# Patient Record
Sex: Female | Born: 1985 | Race: White | Hispanic: No | Marital: Married | Smoking: Former smoker
Health system: Southern US, Community
[De-identification: ages and names within clinical notes are randomized; demographics above are authoritative.]

## PROBLEM LIST (undated history)

## (undated) DIAGNOSIS — I10 Essential (primary) hypertension: Secondary | ICD-10-CM

## (undated) DIAGNOSIS — IMO0001 Reserved for inherently not codable concepts without codable children: Secondary | ICD-10-CM

## (undated) DIAGNOSIS — M1990 Unspecified osteoarthritis, unspecified site: Secondary | ICD-10-CM

## (undated) DIAGNOSIS — K219 Gastro-esophageal reflux disease without esophagitis: Secondary | ICD-10-CM

## (undated) HISTORY — PX: OTHER SURGICAL HISTORY: SHX169

## (undated) HISTORY — PX: GYNECOLOGIC CRYOSURGERY: SHX857

---
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
## 2004-09-18 ENCOUNTER — Encounter: Admission: RE | Admit: 2004-09-18 | Discharge: 2004-09-18 | Payer: Self-pay | Admitting: Family Medicine

## 2004-10-03 ENCOUNTER — Other Ambulatory Visit: Admission: RE | Admit: 2004-10-03 | Discharge: 2004-10-03 | Payer: Self-pay | Admitting: Obstetrics and Gynecology

## 2005-04-18 ENCOUNTER — Other Ambulatory Visit: Admission: RE | Admit: 2005-04-18 | Discharge: 2005-04-18 | Payer: Self-pay | Admitting: Obstetrics and Gynecology

## 2005-09-05 ENCOUNTER — Ambulatory Visit (HOSPITAL_COMMUNITY): Admission: RE | Admit: 2005-09-05 | Discharge: 2005-09-05 | Payer: Self-pay | Admitting: Specialist

## 2005-09-05 ENCOUNTER — Ambulatory Visit (HOSPITAL_BASED_OUTPATIENT_CLINIC_OR_DEPARTMENT_OTHER): Admission: RE | Admit: 2005-09-05 | Discharge: 2005-09-05 | Payer: Self-pay | Admitting: Specialist

## 2005-09-15 HISTORY — PX: KNEE ARTHROSCOPY: SUR90

## 2005-10-10 ENCOUNTER — Other Ambulatory Visit: Admission: RE | Admit: 2005-10-10 | Discharge: 2005-10-10 | Payer: Self-pay | Admitting: Obstetrics and Gynecology

## 2006-11-26 ENCOUNTER — Other Ambulatory Visit: Admission: RE | Admit: 2006-11-26 | Discharge: 2006-11-26 | Payer: Self-pay | Admitting: Obstetrics and Gynecology

## 2007-06-21 ENCOUNTER — Encounter: Admission: RE | Admit: 2007-06-21 | Discharge: 2007-06-21 | Payer: Self-pay | Admitting: Family Medicine

## 2007-07-26 ENCOUNTER — Encounter: Admission: RE | Admit: 2007-07-26 | Discharge: 2007-07-26 | Payer: Self-pay | Admitting: Gastroenterology

## 2007-09-16 HISTORY — PX: CHOLECYSTECTOMY: SHX55

## 2007-12-02 ENCOUNTER — Other Ambulatory Visit: Admission: RE | Admit: 2007-12-02 | Discharge: 2007-12-02 | Payer: Self-pay | Admitting: Obstetrics and Gynecology

## 2009-07-30 ENCOUNTER — Inpatient Hospital Stay (HOSPITAL_COMMUNITY): Admission: AD | Admit: 2009-07-30 | Discharge: 2009-07-31 | Payer: Self-pay | Admitting: Obstetrics and Gynecology

## 2009-08-01 ENCOUNTER — Inpatient Hospital Stay (HOSPITAL_COMMUNITY): Admission: AD | Admit: 2009-08-01 | Discharge: 2009-08-01 | Payer: Self-pay | Admitting: Obstetrics and Gynecology

## 2009-08-02 ENCOUNTER — Inpatient Hospital Stay (HOSPITAL_COMMUNITY): Admission: AD | Admit: 2009-08-02 | Discharge: 2009-08-03 | Payer: Self-pay | Admitting: Obstetrics and Gynecology

## 2009-08-07 ENCOUNTER — Inpatient Hospital Stay (HOSPITAL_COMMUNITY): Admission: AD | Admit: 2009-08-07 | Discharge: 2009-08-09 | Payer: Self-pay | Admitting: Obstetrics and Gynecology

## 2009-08-08 ENCOUNTER — Encounter: Payer: Self-pay | Admitting: Obstetrics and Gynecology

## 2009-08-10 ENCOUNTER — Ambulatory Visit (HOSPITAL_COMMUNITY): Admission: RE | Admit: 2009-08-10 | Discharge: 2009-08-10 | Payer: Self-pay | Admitting: Obstetrics and Gynecology

## 2009-08-17 ENCOUNTER — Ambulatory Visit: Payer: Self-pay | Admitting: Obstetrics and Gynecology

## 2009-08-17 ENCOUNTER — Inpatient Hospital Stay (HOSPITAL_COMMUNITY): Admission: AD | Admit: 2009-08-17 | Discharge: 2009-08-21 | Payer: Self-pay | Admitting: Obstetrics & Gynecology

## 2009-08-18 ENCOUNTER — Encounter (INDEPENDENT_AMBULATORY_CARE_PROVIDER_SITE_OTHER): Payer: Self-pay | Admitting: Obstetrics & Gynecology

## 2009-08-22 ENCOUNTER — Encounter: Admission: RE | Admit: 2009-08-22 | Discharge: 2009-09-13 | Payer: Self-pay | Admitting: Obstetrics and Gynecology

## 2009-09-22 ENCOUNTER — Encounter: Admission: RE | Admit: 2009-09-22 | Discharge: 2009-10-02 | Payer: Self-pay | Admitting: Obstetrics and Gynecology

## 2010-09-14 IMAGING — US US FETAL BPP W/O NONSTRESS
1 series · 7 of 7 positions shown · non-contrast
Comparison: none

OBSTETRICAL ULTRASOUND:
 This ultrasound exam was performed in the [HOSPITAL] Ultrasound Department.  The OB US report was generated in the AS system, and faxed to the ordering physician.  This report is also available in [HOSPITAL]?s AccessANYware and in [REDACTED] PACS.

[Series 1: us fetal bpp w/o nonstress · non-contrast · 7 acquisitions, 7 frames shown]
[im 1/7]
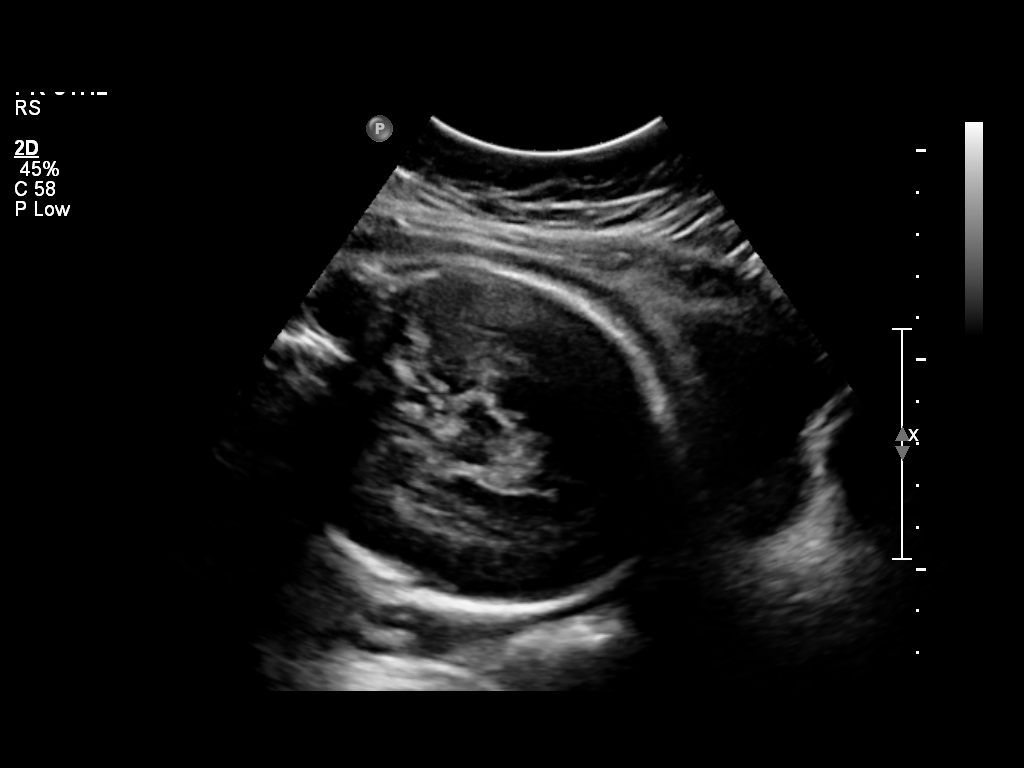
[im 2/7]
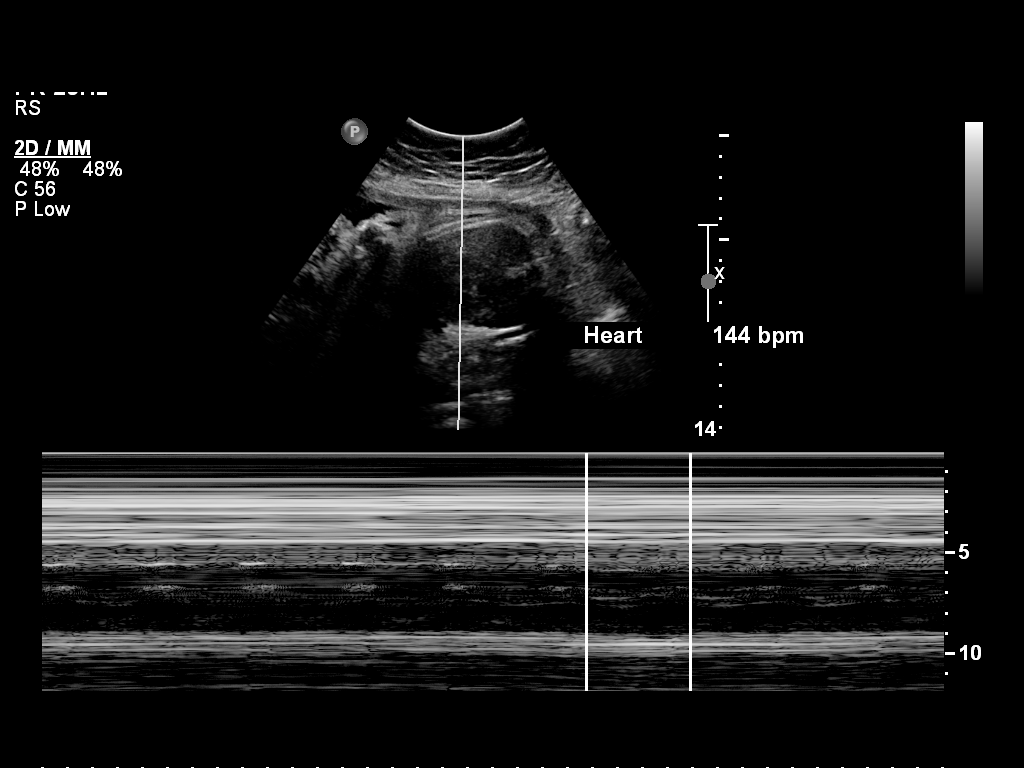
[im 3/7]
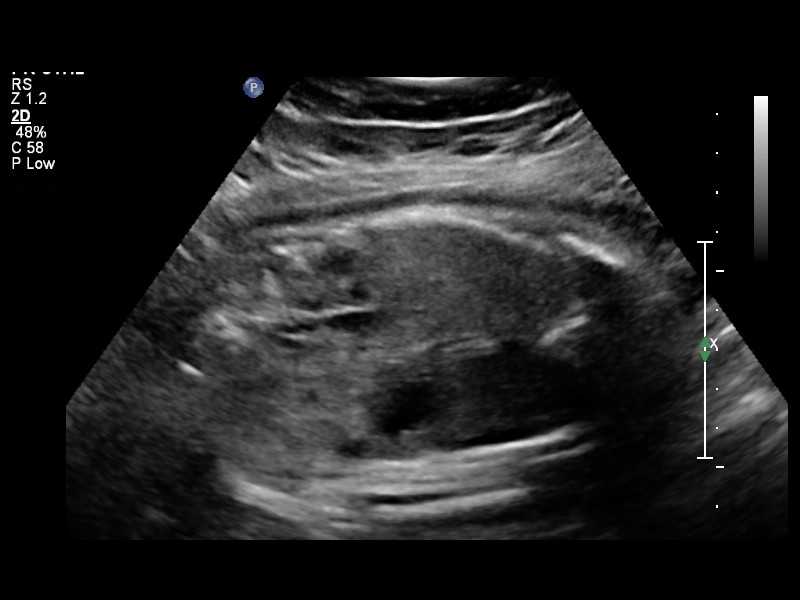
[im 4/7]
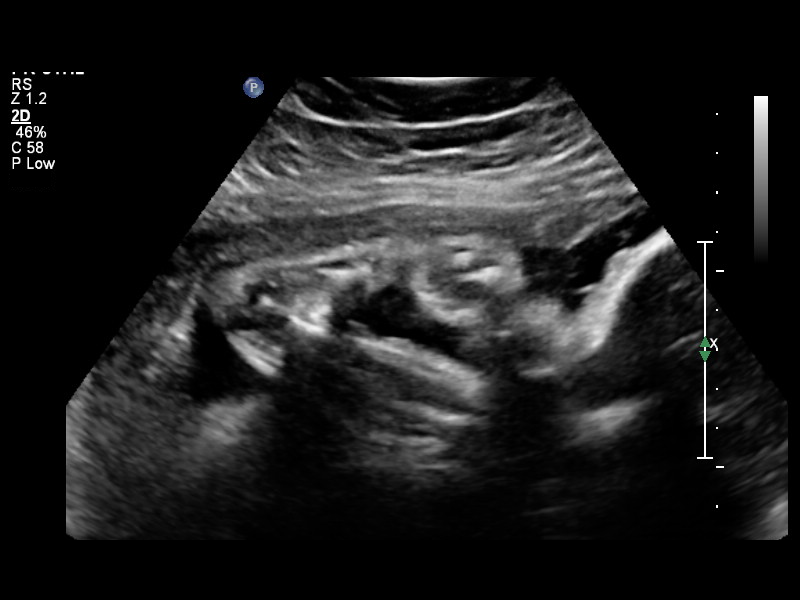
[im 5/7]
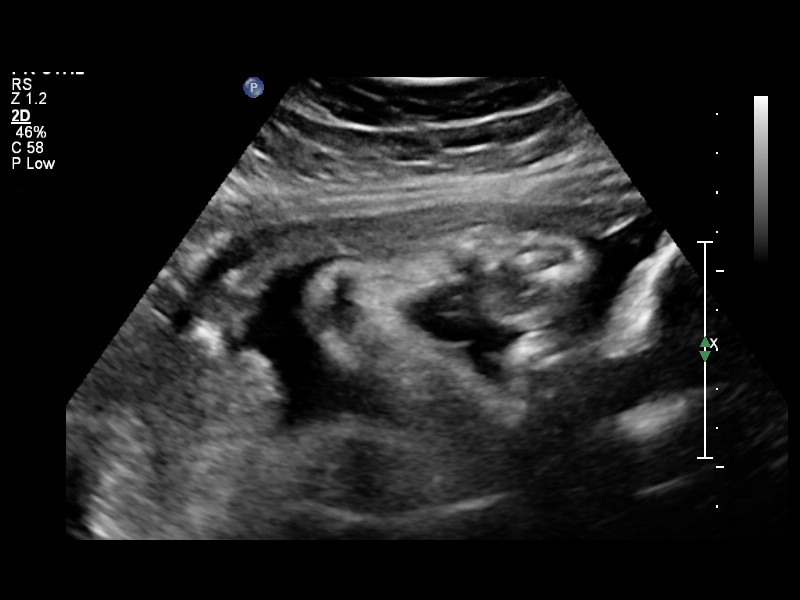
[im 6/7]
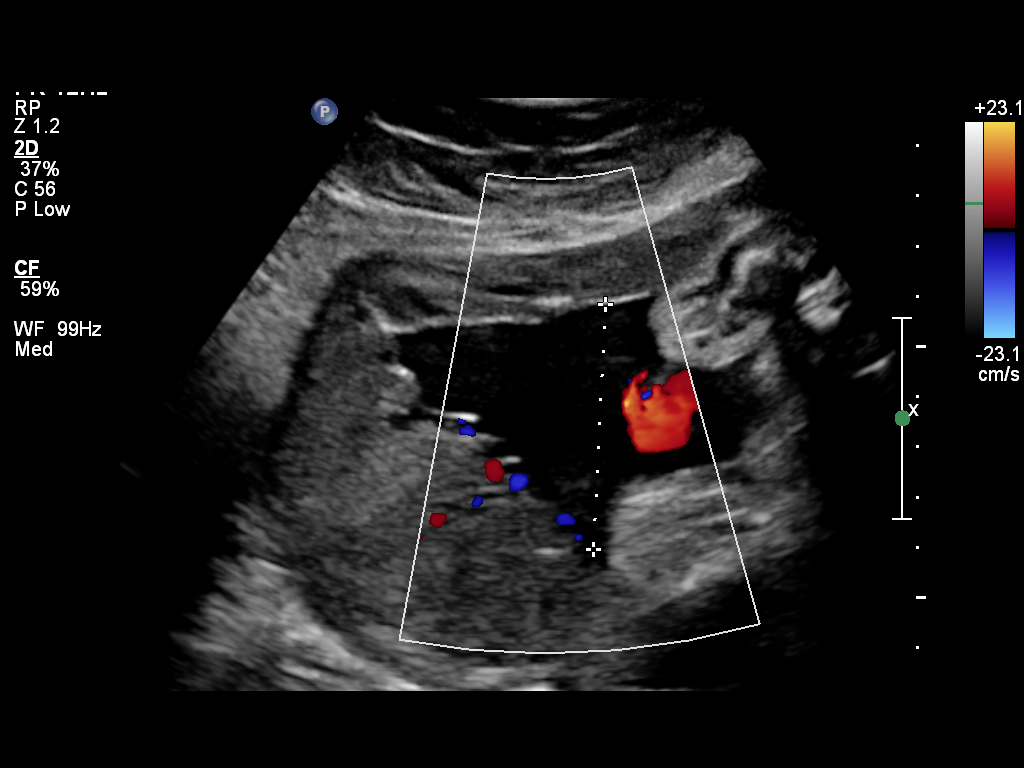
[im 7/7]
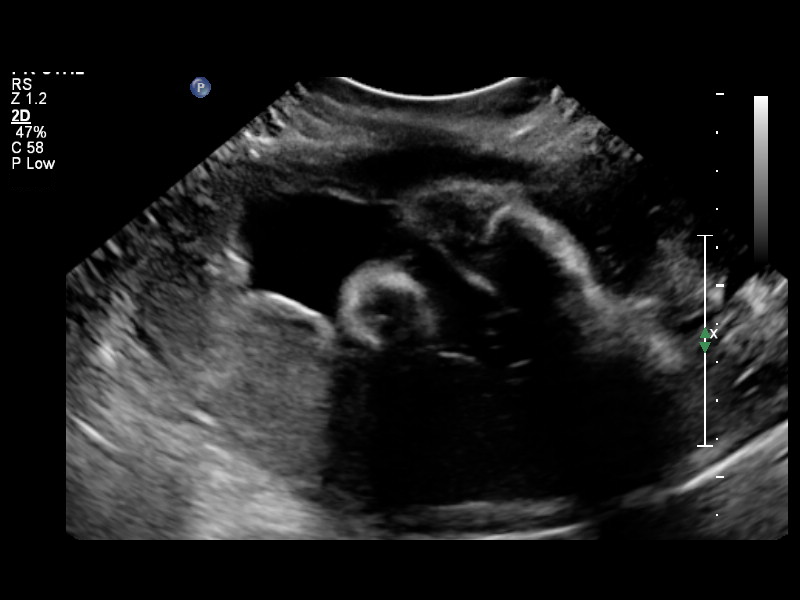

[7 of 7 positions shown; findings below may reference images not displayed]

IMPRESSION: See AS Obstetric US report.

## 2010-10-05 ENCOUNTER — Encounter: Payer: Self-pay | Admitting: Family Medicine

## 2010-12-17 LAB — CREATININE CLEARANCE, URINE, 24 HOUR
Collection Interval-CRCL: 24 hours
Creatinine, Urine: 63.4 mg/dL
Urine Total Volume-CRCL: 2625 mL

## 2010-12-17 LAB — CBC
HCT: 37.3 % (ref 36.0–46.0)
HCT: 40.3 % (ref 36.0–46.0)
Hemoglobin: 12.7 g/dL (ref 12.0–15.0)
Hemoglobin: 13.2 g/dL (ref 12.0–15.0)
Hemoglobin: 13.6 g/dL (ref 12.0–15.0)
MCHC: 33.8 g/dL (ref 30.0–36.0)
MCHC: 33.9 g/dL (ref 30.0–36.0)
MCHC: 34.6 g/dL (ref 30.0–36.0)
MCV: 94.7 fL (ref 78.0–100.0)
MCV: 95.4 fL (ref 78.0–100.0)
RBC: 3.57 MIL/uL — ABNORMAL LOW (ref 3.87–5.11)
RBC: 3.97 MIL/uL (ref 3.87–5.11)
RBC: 4.05 MIL/uL (ref 3.87–5.11)
RBC: 4.25 MIL/uL (ref 3.87–5.11)
RDW: 13.4 % (ref 11.5–15.5)
RDW: 13.5 % (ref 11.5–15.5)
RDW: 14.2 % (ref 11.5–15.5)
WBC: 14.1 10*3/uL — ABNORMAL HIGH (ref 4.0–10.5)
WBC: 15.5 10*3/uL — ABNORMAL HIGH (ref 4.0–10.5)

## 2010-12-17 LAB — COMPREHENSIVE METABOLIC PANEL
ALT: 22 U/L (ref 0–35)
ALT: 27 U/L (ref 0–35)
AST: 25 U/L (ref 0–37)
AST: 26 U/L (ref 0–37)
AST: 26 U/L (ref 0–37)
Alkaline Phosphatase: 85 U/L (ref 39–117)
Alkaline Phosphatase: 87 U/L (ref 39–117)
BUN: 7 mg/dL (ref 6–23)
CO2: 22 mEq/L (ref 19–32)
CO2: 23 mEq/L (ref 19–32)
CO2: 23 mEq/L (ref 19–32)
CO2: 24 mEq/L (ref 19–32)
Calcium: 8.2 mg/dL — ABNORMAL LOW (ref 8.4–10.5)
Calcium: 8.9 mg/dL (ref 8.4–10.5)
Calcium: 9.4 mg/dL (ref 8.4–10.5)
Chloride: 102 mEq/L (ref 96–112)
Chloride: 104 mEq/L (ref 96–112)
Creatinine, Ser: 0.57 mg/dL (ref 0.4–1.2)
Creatinine, Ser: 0.63 mg/dL (ref 0.4–1.2)
GFR calc Af Amer: >60 mL/min (ref >60)
GFR calc Af Amer: >60 mL/min (ref >60)
GFR calc non Af Amer: >60 mL/min (ref >60)
GFR calc non Af Amer: >60 mL/min (ref >60)
Glucose, Bld: 71 mg/dL (ref 70–99)
Glucose, Bld: 71 mg/dL (ref 70–99)
Glucose, Bld: 75 mg/dL (ref 70–99)
Glucose, Bld: 81 mg/dL (ref 70–99)
Potassium: 3.9 mEq/L (ref 3.5–5.1)
Potassium: 4.1 mEq/L (ref 3.5–5.1)
Sodium: 132 mEq/L — ABNORMAL LOW (ref 135–145)
Sodium: 135 mEq/L (ref 135–145)
Total Bilirubin: 0.1 mg/dL — ABNORMAL LOW (ref 0.3–1.2)
Total Bilirubin: 0.2 mg/dL — ABNORMAL LOW (ref 0.3–1.2)
Total Protein: 5.2 g/dL — ABNORMAL LOW (ref 6.0–8.3)
Total Protein: 5.4 g/dL — ABNORMAL LOW (ref 6.0–8.3)
Total Protein: 5.6 g/dL — ABNORMAL LOW (ref 6.0–8.3)

## 2010-12-17 LAB — ABO/RH: ABO/RH(D): A POS

## 2010-12-17 LAB — PROTEIN, URINE, 24 HOUR
Protein, 24H Urine: 971 mg/d — ABNORMAL HIGH (ref 50–100)
Protein, Urine: 37 mg/dL

## 2010-12-17 LAB — TYPE AND SCREEN: Antibody Screen: NEGATIVE

## 2010-12-17 LAB — URIC ACID
Uric Acid, Serum: 6.1 mg/dL (ref 2.4–7.0)
Uric Acid, Serum: 6.7 mg/dL (ref 2.4–7.0)

## 2010-12-17 LAB — LACTATE DEHYDROGENASE
LDH: 143 U/L (ref 94–250)
LDH: 177 U/L (ref 94–250)

## 2010-12-18 LAB — CBC
HCT: 37.1 % (ref 36.0–46.0)
HCT: 39.3 % (ref 36.0–46.0)
Hemoglobin: 13.3 g/dL (ref 12.0–15.0)
Hemoglobin: 13.3 g/dL (ref 12.0–15.0)
MCHC: 34 g/dL (ref 30.0–36.0)
MCV: 94.1 fL (ref 78.0–100.0)
Platelets: 202 10*3/uL (ref 150–400)
Platelets: 233 10*3/uL (ref 150–400)
Platelets: 228 10*3/uL (ref 150–400)
RBC: 4.13 MIL/uL (ref 3.87–5.11)
RDW: 13.4 % (ref 11.5–15.5)
RDW: 13.5 % (ref 11.5–15.5)
RDW: 13.4 % (ref 11.5–15.5)
WBC: 20.4 10*3/uL — ABNORMAL HIGH (ref 4.0–10.5)
WBC: 20.5 10*3/uL — ABNORMAL HIGH (ref 4.0–10.5)

## 2010-12-18 LAB — COMPREHENSIVE METABOLIC PANEL
ALT: 17 U/L (ref 0–35)
ALT: 24 U/L (ref 0–35)
ALT: 16 U/L (ref 0–35)
AST: 26 U/L (ref 0–37)
AST: 39 U/L — ABNORMAL HIGH (ref 0–37)
Albumin: 2.3 g/dL — ABNORMAL LOW (ref 3.5–5.2)
Albumin: 2.6 g/dL — ABNORMAL LOW (ref 3.5–5.2)
Albumin: 2.6 g/dL — ABNORMAL LOW (ref 3.5–5.2)
Alkaline Phosphatase: 91 U/L (ref 39–117)
Alkaline Phosphatase: 94 U/L (ref 39–117)
Alkaline Phosphatase: 96 U/L (ref 39–117)
BUN: 9 mg/dL (ref 6–23)
CO2: 21 mEq/L (ref 19–32)
Calcium: 8.8 mg/dL (ref 8.4–10.5)
Calcium: 9.1 mg/dL (ref 8.4–10.5)
Chloride: 103 mEq/L (ref 96–112)
Creatinine, Ser: 0.58 mg/dL (ref 0.4–1.2)
GFR calc Af Amer: >60 mL/min (ref >60)
GFR calc Af Amer: >60 mL/min (ref >60)
GFR calc Af Amer: >60 mL/min (ref >60)
GFR calc non Af Amer: >60 mL/min (ref >60)
GFR calc non Af Amer: >60 mL/min (ref >60)
Glucose, Bld: 77 mg/dL (ref 70–99)
Potassium: 3.8 mEq/L (ref 3.5–5.1)
Potassium: 4.4 mEq/L (ref 3.5–5.1)
Potassium: 3.9 mEq/L (ref 3.5–5.1)
Sodium: 133 mEq/L — ABNORMAL LOW (ref 135–145)
Total Bilirubin: 0.1 mg/dL — ABNORMAL LOW (ref 0.3–1.2)
Total Bilirubin: 0.2 mg/dL — ABNORMAL LOW (ref 0.3–1.2)
Total Protein: 5.4 g/dL — ABNORMAL LOW (ref 6.0–8.3)
Total Protein: 5.8 g/dL — ABNORMAL LOW (ref 6.0–8.3)

## 2010-12-18 LAB — PROTEIN, URINE, 24 HOUR: Protein, 24H Urine: 280 mg/d — ABNORMAL HIGH (ref 50–100)

## 2010-12-18 LAB — CREATININE CLEARANCE, URINE, 24 HOUR
Collection Interval-CRCL: 24 hours
Creatinine Clearance: 222 mL/min — ABNORMAL HIGH (ref 75–115)
Creatinine, 24H Ur: 1696 mg/d (ref 700–1800)
Creatinine, Urine: 48.5 mg/dL
Urine Total Volume-CRCL: 3500 mL

## 2010-12-18 LAB — URIC ACID: Uric Acid, Serum: 5.7 mg/dL (ref 2.4–7.0)

## 2010-12-18 LAB — LACTATE DEHYDROGENASE
LDH: 126 U/L (ref 94–250)
LDH: 120 U/L (ref 94–250)

## 2011-01-31 NOTE — Op Note (Signed)
Dana [name], Dana [name]             ACCOUNT NO.:  [id number]   MEDICAL RECORD NO.:  [id number]          PATIENT TYPE:  AMB   LOCATION:  [facility]                         FACILITY:  [facility]   PHYSICIAN:  Erasmo Leventhal, M.D.DATE OF BIRTH:  Feb 03, 1986   DATE OF PROCEDURE:  09/05/2005  DATE OF DISCHARGE:  09/05/2005                                 OPERATIVE REPORT   PREOPERATIVE DIAGNOSES:  Right knee patellofemoral maltracking.   POSTOPERATIVE DIAGNOSES:  Right knee patellofemoral maltracking.   PROCEDURE:  Right knee arthroscopic parapatellar synovectomy with  arthroscopic lateral release.   SURGEON:  Erasmo Leventhal, M.D.   ANESTHESIA:  Local block with monitored anesthesia care.   ESTIMATED BLOOD LOSS:  Less than 10 mL.   DRAINS:  None.   COMPLICATIONS:  None.   TOURNIQUET TIME:  None.   DISPOSITION:  PACU stable.   DESCRIPTION OF PROCEDURE:  The patient and family counseled in the holding  area, correct side was identified. Taken to the OR, placed in supine  position. Under MAC anesthesia, the block had been administered in the  holding area previously. Preoperative IV antibiotics had been ordered. She  was then placed on the OR table, placed under monitored anesthesia care,  placed in a thigh holder, prepped with DuraPrep, draped in a sterile  fashion. The standard three portal arthroscopy performed with a proximal  medial, anteromedial and anterolateral portal. Diagnostic arthroscopy was  undertaken. The patellofemoral joint revealed marked lateral tilting. There  was no significant chondromalacia but there was rather noticeable  parapatellar synovitis. Peripatellar synovectomy was performed. The femoral  trochlea was somewhat flat. ACL and PCL were intact.  Medial side was  inspected, medial compartment showed normal articular cartilage, normal  medial meniscus. The lateral side was inspected, normal articular cartilage,  normal lateral meniscus. At this point  in time, again after the peripatellar  synovectomy was performed with motorized shaver, the arthroscopic lateral  release was begun beginning 2 fingerbreadth's superior lateral patella just  distal to the vastus lateralis tendon going sequentially through the  synovium capsule retinaculum being very careful in the subcutaneous tissue  and skin. Patellofemoral tracking was markedly improved at this point in  time. Pump pressure was decreased, meticulous hemostasis was obtained, knee  was irrigated. Arthroscopic equipment was removed. After performing  anesthesia, I supplemented the knee block with a total of 30 mL of 0.25%  Marcaine with epinephrine and 4 mg of morphine sulfate placed into the  portal sites and into the joint. A  sterile dressing was applied to the knee along with a lateral bolster, TED  hose, ice packs, knee immobilizer in full extension. She tolerated the  procedure well with no complications. She was gently awakened, taken from  the operating room to PACU in stable condition.           ______________________________  Erasmo Leventhal, M.D.     RAC/MEDQ  D:  09/05/2005  T:  09/09/2005  Job:  [id number]

## 2015-01-10 LAB — OB RESULTS CONSOLE HIV ANTIBODY (ROUTINE TESTING): HIV: NONREACTIVE

## 2015-01-10 LAB — OB RESULTS CONSOLE ABO/RH: RH TYPE: POSITIVE

## 2015-01-10 LAB — OB RESULTS CONSOLE GC/CHLAMYDIA
CHLAMYDIA, DNA PROBE: NEGATIVE
Gonorrhea: NEGATIVE

## 2015-01-10 LAB — OB RESULTS CONSOLE RPR: RPR: NONREACTIVE

## 2015-01-10 LAB — OB RESULTS CONSOLE HEPATITIS B SURFACE ANTIGEN: HEP B S AG: NEGATIVE

## 2015-01-10 LAB — OB RESULTS CONSOLE ANTIBODY SCREEN: ANTIBODY SCREEN: NEGATIVE

## 2015-01-10 LAB — OB RESULTS CONSOLE RUBELLA ANTIBODY, IGM: RUBELLA: NON-IMMUNE/NOT IMMUNE

## 2015-01-31 ENCOUNTER — Other Ambulatory Visit: Payer: Self-pay | Admitting: Obstetrics and Gynecology

## 2015-02-01 LAB — CYTOLOGY - PAP

## 2015-07-31 NOTE — H&P (Signed)
Dana [name] is a 29 y.o. female presenting at 5739 weeks for repeat cesarean section. 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   Desires repeat cesarean section declines TOL.  Prenatal course complicated by chronic hypertension on hydrochlorothiazide.  Maternal Medical History:  Fetal activity: Perceived fetal activity is normal.    Prenatal complications: PIH.   Prenatal Complications - Diabetes: none.    OB History    No data available     No past medical history on file. No past surgical history on file. Family History: family history is not on file. Social History:  has no tobacco, alcohol, and drug history on file.   Prenatal Transfer Tool  Maternal Diabetes: No Genetic Screening: Normal Maternal Ultrasounds/Referrals: Normal Fetal Ultrasounds or other Referrals:  None Maternal Substance Abuse:  No Significant Maternal Medications:  None Significant Maternal Lab Results:  None Other Comments:  None  ROS    There were no vitals taken for this visit. Maternal Exam:  Abdomen: Surgical scars: low transverse.   Fetal presentation: vertex     Physical Exam  Constitutional: She is oriented to person, place, and time. She appears well-developed and well-nourished.  HENT:  Head: Normocephalic.  Cardiovascular: Normal rate, regular rhythm and normal heart sounds.   Respiratory: Effort normal and breath sounds normal.  GI:  Gravid uterus c/w dates  Well healed low transverse incision.  Musculoskeletal: Normal range of motion. She exhibits edema.  Neurological: She is alert and oriented to person, place, and time. She has normal reflexes.    Prenatal labs: ABO, Rh:   Antibody:   Rubella:   RPR:    HBsAg:    HIV:    GBS:     Assessment/Plan: IUP at 39 weeks for repeat cesarean section Chronic hypertension Risk of cesarean section discussed.  These include:  Risk of infection;  Risk of hemorrhage that could require transfusions with the  associated risk of aids or hepatitis;  Excessive bleeding could require hysterectomy;  Risk of injury to adjacent organs including bladder, bowel or ureters;  Risk of DVT's and possible pulmonary embolus.  Patient expresses a understanding of indications and risks.;   Dana [name] S 07/31/2015, 9:58 AM

## 2015-08-08 ENCOUNTER — Other Ambulatory Visit (HOSPITAL_COMMUNITY): Payer: Self-pay

## 2015-08-08 ENCOUNTER — Encounter (HOSPITAL_COMMUNITY): Payer: Self-pay

## 2015-08-08 NOTE — Patient Instructions (Addendum)
Your procedure is scheduled on:  Monday, Nov. 28, 2016  [facility] at:  11:30 AM  Pick up the phone at the desk and dial [phone].  Call this number if you have problems the morning of surgery: [phone].  Remember: Do NOT eat food:  After Midnight Sunday Do NOT drink clear liquids after: 9:00 AM Day of Surgery Take these medicines the morning of surgery with a SIP OF WATER:  Hydrochlorothiazide, Zantac, Dexilant   Do NOT wear jewelry (body piercing), metal hair clips/bobby pins, or nail polish. Do NOT wear lotions, powders, or perfumes.  You may wear deoderant. Do NOT shave for 48 hours prior to surgery. Do NOT bring valuables to the hospital. Leave suitcase in car.  After surgery it may be brought to your room.  For patients admitted to the hospital, checkout time is 11:00 AM the day of discharge.

## 2015-08-10 ENCOUNTER — Encounter (HOSPITAL_COMMUNITY)
Admission: RE | Admit: 2015-08-10 | Discharge: 2015-08-10 | Disposition: A | Discharge status: Home / Self Care | Payer: PRIVATE HEALTH INSURANCE | Source: Ambulatory Visit | Attending: Obstetrics and Gynecology | Admitting: Obstetrics and Gynecology

## 2015-08-10 ENCOUNTER — Encounter (HOSPITAL_COMMUNITY): Payer: Self-pay

## 2015-08-10 HISTORY — DX: Gastro-esophageal reflux disease without esophagitis: K21.9

## 2015-08-10 HISTORY — DX: Reserved for inherently not codable concepts without codable children: IMO0001

## 2015-08-10 HISTORY — DX: Unspecified osteoarthritis, unspecified site: M19.90

## 2015-08-10 HISTORY — DX: Essential (primary) hypertension: I10

## 2015-08-10 LAB — CBC
HEMATOCRIT: 38.3 % (ref 36.0–46.0)
Hemoglobin: 12.9 g/dL (ref 12.0–15.0)
MCH: 30.7 pg (ref 26.0–34.0)
MCHC: 33.7 g/dL (ref 30.0–36.0)
MCV: 91.2 fL (ref 78.0–100.0)
Platelets: 306 10*3/uL (ref 150–400)
RBC: 4.2 MIL/uL (ref 3.87–5.11)
RDW: 13.3 % (ref 11.5–15.5)
WBC: 14.6 10*3/uL — AB (ref 4.0–10.5)

## 2015-08-10 LAB — BASIC METABOLIC PANEL
ANION GAP: 10 (ref 5–15)
BUN: 7 mg/dL (ref 6–20)
CHLORIDE: 101 mmol/L (ref 101–111)
CO2: 23 mmol/L (ref 22–32)
Calcium: 9.4 mg/dL (ref 8.9–10.3)
Creatinine, Ser: 0.42 mg/dL — ABNORMAL LOW (ref 0.44–1.00)
Glucose, Bld: 92 mg/dL (ref 65–99)
POTASSIUM: 3.5 mmol/L (ref 3.5–5.1)
SODIUM: 134 mmol/L — AB (ref 135–145)

## 2015-08-11 LAB — RPR: RPR Ser Ql: NONREACTIVE

## 2015-08-12 MED ORDER — GENTAMICIN SULFATE 40 MG/ML IJ SOLN
INTRAMUSCULAR | Status: AC
  Administered 2015-08-13: 114.75 mL via INTRAVENOUS
  Filled 2015-08-12: qty 8.75

## 2015-08-13 ENCOUNTER — Encounter (HOSPITAL_COMMUNITY): Payer: Self-pay | Admitting: *Deleted

## 2015-08-13 ENCOUNTER — Inpatient Hospital Stay (HOSPITAL_COMMUNITY): Payer: PRIVATE HEALTH INSURANCE | Admitting: Anesthesiology

## 2015-08-13 ENCOUNTER — Inpatient Hospital Stay (HOSPITAL_COMMUNITY)
Admission: RE | Admit: 2015-08-13 | Discharge: 2015-08-15 | DRG: 766 | Disposition: A | Discharge status: Home / Self Care | Payer: PRIVATE HEALTH INSURANCE | Source: Ambulatory Visit | Attending: Obstetrics and Gynecology | Admitting: Obstetrics and Gynecology

## 2015-08-13 ENCOUNTER — Encounter (HOSPITAL_COMMUNITY)
Admission: RE | Disposition: A | Discharge status: Home / Self Care | Payer: Self-pay | Source: Ambulatory Visit | Attending: Obstetrics and Gynecology

## 2015-08-13 DIAGNOSIS — O164 Unspecified maternal hypertension, complicating childbirth: Secondary | ICD-10-CM | POA: Diagnosis present

## 2015-08-13 DIAGNOSIS — Z98891 History of uterine scar from previous surgery: Secondary | ICD-10-CM

## 2015-08-13 DIAGNOSIS — E669 Obesity, unspecified: Secondary | ICD-10-CM | POA: Diagnosis present

## 2015-08-13 DIAGNOSIS — Z3A39 39 weeks gestation of pregnancy: Secondary | ICD-10-CM | POA: Diagnosis not present

## 2015-08-13 DIAGNOSIS — M1990 Unspecified osteoarthritis, unspecified site: Secondary | ICD-10-CM | POA: Diagnosis present

## 2015-08-13 DIAGNOSIS — O34211 Maternal care for low transverse scar from previous cesarean delivery: Principal | ICD-10-CM | POA: Diagnosis present

## 2015-08-13 DIAGNOSIS — K219 Gastro-esophageal reflux disease without esophagitis: Secondary | ICD-10-CM | POA: Diagnosis present

## 2015-08-13 DIAGNOSIS — O9962 Diseases of the digestive system complicating childbirth: Secondary | ICD-10-CM | POA: Diagnosis present

## 2015-08-13 DIAGNOSIS — Z87891 Personal history of nicotine dependence: Secondary | ICD-10-CM | POA: Diagnosis not present

## 2015-08-13 DIAGNOSIS — Z6835 Body mass index (BMI) 35.0-35.9, adult: Secondary | ICD-10-CM | POA: Diagnosis not present

## 2015-08-13 DIAGNOSIS — O99214 Obesity complicating childbirth: Secondary | ICD-10-CM | POA: Diagnosis present

## 2015-08-13 LAB — PREPARE RBC (CROSSMATCH)

## 2015-08-13 SURGERY — Surgical Case
Anesthesia: Spinal

## 2015-08-13 MED ORDER — NALOXONE HCL 2 MG/2ML IJ SOSY
1.0000 ug/kg/h | PREFILLED_SYRINGE | INTRAMUSCULAR | Status: DC | PRN
Start: 2015-08-13 — End: 2015-08-15
  Filled 2015-08-13: qty 2

## 2015-08-13 MED ORDER — BISACODYL 10 MG RE SUPP: 10.0000 mg | Freq: Every day | RECTAL | Status: DC | PRN

## 2015-08-13 MED ORDER — OXYTOCIN 10 UNIT/ML IJ SOLN
INTRAMUSCULAR | Status: AC
  Filled 2015-08-13: qty 4

## 2015-08-13 MED ORDER — SENNOSIDES-DOCUSATE SODIUM 8.6-50 MG PO TABS
2.0000 | ORAL_TABLET | ORAL | Status: DC
  Administered 2015-08-14: 2 via ORAL
  Filled 2015-08-13 (×2): qty 2

## 2015-08-13 MED ORDER — MORPHINE SULFATE (PF) 0.5 MG/ML IJ SOLN
INTRAMUSCULAR | Status: AC
  Filled 2015-08-13: qty 10

## 2015-08-13 MED ORDER — MENTHOL 3 MG MT LOZG
1.0000 | LOZENGE | OROMUCOSAL | Status: DC | PRN
Start: 2015-08-13 — End: 2015-08-15

## 2015-08-13 MED ORDER — DEXAMETHASONE SODIUM PHOSPHATE 4 MG/ML IJ SOLN
INTRAMUSCULAR | Status: DC | PRN
  Administered 2015-08-13: 4 mg via INTRAVENOUS

## 2015-08-13 MED ORDER — SODIUM CHLORIDE 0.9 % IJ SOLN: 3.0000 mL | INTRAMUSCULAR | Status: DC | PRN

## 2015-08-13 MED ORDER — SIMETHICONE 80 MG PO CHEW
80.0000 mg | CHEWABLE_TABLET | Freq: Three times a day (TID) | ORAL | Status: DC
Start: 2015-08-13 — End: 2015-08-15
  Administered 2015-08-13 – 2015-08-15 (×4): 80 mg via ORAL
  Filled 2015-08-13 (×4): qty 1

## 2015-08-13 MED ORDER — OXYCODONE-ACETAMINOPHEN 5-325 MG PO TABS: 2.0000 | ORAL_TABLET | ORAL | Status: DC | PRN

## 2015-08-13 MED ORDER — NALBUPHINE HCL 10 MG/ML IJ SOLN: 5.0000 mg | INTRAMUSCULAR | Status: DC | PRN

## 2015-08-13 MED ORDER — FENTANYL CITRATE (PF) 100 MCG/2ML IJ SOLN
INTRAMUSCULAR | Status: AC
  Filled 2015-08-13: qty 2

## 2015-08-13 MED ORDER — SCOPOLAMINE 1 MG/3DAYS TD PT72
1.0000 | MEDICATED_PATCH | Freq: Once | TRANSDERMAL | Status: DC
  Administered 2015-08-13: 1.5 mg via TRANSDERMAL

## 2015-08-13 MED ORDER — SCOPOLAMINE 1 MG/3DAYS TD PT72
MEDICATED_PATCH | TRANSDERMAL | Status: AC
  Administered 2015-08-13: 1.5 mg via TRANSDERMAL
  Filled 2015-08-13: qty 1

## 2015-08-13 MED ORDER — IBUPROFEN 600 MG PO TABS
600.0000 mg | ORAL_TABLET | Freq: Four times a day (QID) | ORAL | Status: DC
  Administered 2015-08-14 – 2015-08-15 (×6): 600 mg via ORAL
  Filled 2015-08-13 (×6): qty 1

## 2015-08-13 MED ORDER — OXYTOCIN 40 UNITS IN LACTATED RINGERS INFUSION - SIMPLE MED: 62.5000 mL/h | INTRAVENOUS | Status: AC

## 2015-08-13 MED ORDER — SODIUM CHLORIDE 0.9 % IV SOLN
250.0000 mL | INTRAVENOUS | Status: DC
Start: 2015-08-14 — End: 2015-08-15

## 2015-08-13 MED ORDER — MORPHINE SULFATE (PF) 0.5 MG/ML IJ SOLN
INTRAMUSCULAR | Status: DC | PRN
  Administered 2015-08-13: .15 mg via INTRATHECAL

## 2015-08-13 MED ORDER — SODIUM CHLORIDE 0.9 % IR SOLN
Status: DC | PRN
  Administered 2015-08-13: 1000 mL

## 2015-08-13 MED ORDER — KETOROLAC TROMETHAMINE 30 MG/ML IJ SOLN
30.0000 mg | Freq: Four times a day (QID) | INTRAMUSCULAR | Status: AC | PRN
  Administered 2015-08-13: 30 mg via INTRAMUSCULAR

## 2015-08-13 MED ORDER — ONDANSETRON HCL 4 MG/2ML IJ SOLN
INTRAMUSCULAR | Status: AC
  Filled 2015-08-13: qty 2

## 2015-08-13 MED ORDER — FLEET ENEMA 7-19 GM/118ML RE ENEM: 1.0000 | ENEMA | Freq: Every day | RECTAL | Status: DC | PRN

## 2015-08-13 MED ORDER — TETANUS-DIPHTH-ACELL PERTUSSIS 5-2.5-18.5 LF-MCG/0.5 IM SUSP
0.5000 mL | Freq: Once | INTRAMUSCULAR | Status: DC
Start: 2015-08-14 — End: 2015-08-15

## 2015-08-13 MED ORDER — KETOROLAC TROMETHAMINE 30 MG/ML IJ SOLN
INTRAMUSCULAR | Status: AC
  Administered 2015-08-13: 30 mg via INTRAMUSCULAR
  Filled 2015-08-13: qty 1

## 2015-08-13 MED ORDER — SIMETHICONE 80 MG PO CHEW
80.0000 mg | CHEWABLE_TABLET | ORAL | Status: DC
  Administered 2015-08-14 (×2): 80 mg via ORAL
  Filled 2015-08-13 (×2): qty 1

## 2015-08-13 MED ORDER — PHENYLEPHRINE 8 MG IN D5W 100 ML (0.08MG/ML) PREMIX OPTIME
INJECTION | INTRAVENOUS | Status: AC
  Filled 2015-08-13: qty 100

## 2015-08-13 MED ORDER — DIPHENHYDRAMINE HCL 50 MG/ML IJ SOLN: 12.5000 mg | INTRAMUSCULAR | Status: DC | PRN

## 2015-08-13 MED ORDER — SIMETHICONE 80 MG PO CHEW: 80.0000 mg | CHEWABLE_TABLET | ORAL | Status: DC | PRN

## 2015-08-13 MED ORDER — KETOROLAC TROMETHAMINE 30 MG/ML IJ SOLN: 30.0000 mg | Freq: Four times a day (QID) | INTRAMUSCULAR | Status: AC | PRN

## 2015-08-13 MED ORDER — SODIUM CHLORIDE 0.9 % IJ SOLN: 3.0000 mL | Freq: Two times a day (BID) | INTRAMUSCULAR | Status: DC

## 2015-08-13 MED ORDER — ZOLPIDEM TARTRATE 5 MG PO TABS: 5.0000 mg | ORAL_TABLET | Freq: Every evening | ORAL | Status: DC | PRN

## 2015-08-13 MED ORDER — OXYTOCIN 10 UNIT/ML IJ SOLN
40.0000 [IU] | INTRAVENOUS | Status: DC | PRN
  Administered 2015-08-13: 40 [IU] via INTRAVENOUS

## 2015-08-13 MED ORDER — FENTANYL CITRATE (PF) 100 MCG/2ML IJ SOLN
INTRAMUSCULAR | Status: DC | PRN
  Administered 2015-08-13: 25 ug via INTRATHECAL

## 2015-08-13 MED ORDER — ONDANSETRON HCL 4 MG/2ML IJ SOLN
INTRAMUSCULAR | Status: DC | PRN
  Administered 2015-08-13: 4 mg via INTRAVENOUS

## 2015-08-13 MED ORDER — MEPERIDINE HCL 25 MG/ML IJ SOLN: 6.2500 mg | INTRAMUSCULAR | Status: DC | PRN

## 2015-08-13 MED ORDER — NALBUPHINE HCL 10 MG/ML IJ SOLN
5.0000 mg | Freq: Once | INTRAMUSCULAR | Status: AC | PRN
  Administered 2015-08-13: 5 mg via INTRAVENOUS

## 2015-08-13 MED ORDER — LANOLIN HYDROUS EX OINT: 1.0000 "application " | TOPICAL_OINTMENT | CUTANEOUS | Status: DC | PRN

## 2015-08-13 MED ORDER — PRENATAL MULTIVITAMIN CH
1.0000 | ORAL_TABLET | Freq: Every day | ORAL | Status: DC
  Administered 2015-08-14: 1 via ORAL
  Filled 2015-08-13: qty 1

## 2015-08-13 MED ORDER — NALBUPHINE HCL 10 MG/ML IJ SOLN
INTRAMUSCULAR | Status: AC
  Filled 2015-08-13: qty 1

## 2015-08-13 MED ORDER — DIBUCAINE 1 % RE OINT: 1.0000 "application " | TOPICAL_OINTMENT | RECTAL | Status: DC | PRN

## 2015-08-13 MED ORDER — LACTATED RINGERS IV SOLN
INTRAVENOUS | Status: DC
  Administered 2015-08-13: 21:00:00 via INTRAVENOUS

## 2015-08-13 MED ORDER — BUPIVACAINE IN DEXTROSE 0.75-8.25 % IT SOLN
INTRATHECAL | Status: DC | PRN
  Administered 2015-08-13: 1.5 mL via INTRATHECAL

## 2015-08-13 MED ORDER — LACTATED RINGERS IV SOLN
INTRAVENOUS | Status: DC
  Administered 2015-08-13 (×2): via INTRAVENOUS

## 2015-08-13 MED ORDER — OXYCODONE-ACETAMINOPHEN 5-325 MG PO TABS: 1.0000 | ORAL_TABLET | ORAL | Status: DC | PRN

## 2015-08-13 MED ORDER — NALBUPHINE HCL 10 MG/ML IJ SOLN: 5.0000 mg | Freq: Once | INTRAMUSCULAR | Status: AC | PRN

## 2015-08-13 MED ORDER — FENTANYL CITRATE (PF) 100 MCG/2ML IJ SOLN: 25.0000 ug | INTRAMUSCULAR | Status: DC | PRN

## 2015-08-13 MED ORDER — DEXAMETHASONE SODIUM PHOSPHATE 4 MG/ML IJ SOLN
INTRAMUSCULAR | Status: AC
  Filled 2015-08-13: qty 1

## 2015-08-13 MED ORDER — IBUPROFEN 600 MG PO TABS: 600.0000 mg | ORAL_TABLET | Freq: Four times a day (QID) | ORAL | Status: DC | PRN

## 2015-08-13 MED ORDER — WITCH HAZEL-GLYCERIN EX PADS: 1.0000 "application " | MEDICATED_PAD | CUTANEOUS | Status: DC | PRN

## 2015-08-13 MED ORDER — PHENYLEPHRINE 8 MG IN D5W 100 ML (0.08MG/ML) PREMIX OPTIME
INJECTION | INTRAVENOUS | Status: DC | PRN
  Administered 2015-08-13: 30 ug/min via INTRAVENOUS

## 2015-08-13 MED ORDER — DIPHENHYDRAMINE HCL 25 MG PO CAPS
25.0000 mg | ORAL_CAPSULE | ORAL | Status: DC | PRN
  Filled 2015-08-13: qty 1

## 2015-08-13 MED ORDER — DIPHENHYDRAMINE HCL 25 MG PO CAPS: 25.0000 mg | ORAL_CAPSULE | Freq: Four times a day (QID) | ORAL | Status: DC | PRN

## 2015-08-13 MED ORDER — HYDROCHLOROTHIAZIDE 25 MG PO TABS
25.0000 mg | ORAL_TABLET | Freq: Every day | ORAL | Status: DC
Start: 2015-08-13 — End: 2015-08-14
  Filled 2015-08-13 (×2): qty 1

## 2015-08-13 MED ORDER — NALOXONE HCL 0.4 MG/ML IJ SOLN: 0.4000 mg | INTRAMUSCULAR | Status: DC | PRN

## 2015-08-13 MED ORDER — ONDANSETRON HCL 4 MG/2ML IJ SOLN: 4.0000 mg | Freq: Three times a day (TID) | INTRAMUSCULAR | Status: DC | PRN

## 2015-08-13 MED ORDER — ACETAMINOPHEN 325 MG PO TABS
650.0000 mg | ORAL_TABLET | ORAL | Status: DC | PRN
  Administered 2015-08-13: 650 mg via ORAL
  Filled 2015-08-13: qty 2

## 2015-08-13 SURGICAL SUPPLY — 29 items
CLAMP CORD UMBIL (MISCELLANEOUS) IMPLANT
CLOTH BEACON ORANGE TIMEOUT ST (SAFETY) ×3 IMPLANT
DRAPE SHEET LG 3/4 BI-LAMINATE (DRAPES) IMPLANT
DRSG OPSITE POSTOP 4X10 (GAUZE/BANDAGES/DRESSINGS) ×3 IMPLANT
DURAPREP 26ML APPLICATOR (WOUND CARE) ×3 IMPLANT
ELECT REM PT RETURN 9FT ADLT (ELECTROSURGICAL) ×3
ELECTRODE REM PT RTRN 9FT ADLT (ELECTROSURGICAL) ×1 IMPLANT
EXTRACTOR VACUUM M CUP 4 TUBE (SUCTIONS) IMPLANT
EXTRACTOR VACUUM M CUP 4' TUBE (SUCTIONS)
GLOVE BIO SURGEON STRL SZ7 (GLOVE) ×3 IMPLANT
GLOVE BIOGEL PI IND STRL 7.0 (GLOVE) ×1 IMPLANT
GLOVE BIOGEL PI INDICATOR 7.0 (GLOVE) ×2
GOWN STRL REUS W/TWL LRG LVL3 (GOWN DISPOSABLE) ×6 IMPLANT
KIT ABG SYR 3ML LUER SLIP (SYRINGE) ×3 IMPLANT
LIQUID BAND (GAUZE/BANDAGES/DRESSINGS) ×3 IMPLANT
NEEDLE HYPO 25X5/8 SAFETYGLIDE (NEEDLE) ×3 IMPLANT
NS IRRIG 1000ML POUR BTL (IV SOLUTION) ×3 IMPLANT
PACK C SECTION WH (CUSTOM PROCEDURE TRAY) ×3 IMPLANT
PAD OB MATERNITY 4.3X12.25 (PERSONAL CARE ITEMS) ×3 IMPLANT
PENCIL SMOKE EVAC W/HOLSTER (ELECTROSURGICAL) ×3 IMPLANT
SUT CHROMIC 0 CTX 36 (SUTURE) ×6 IMPLANT
SUT MON AB 4-0 PS1 27 (SUTURE) ×3 IMPLANT
SUT PDS AB 0 CT 36 (SUTURE) ×3 IMPLANT
SUT PLAIN 0 NONE (SUTURE) IMPLANT
SUT PLAIN 2 0 XLH (SUTURE) IMPLANT
SUT VIC AB 3-0 CT1 27 (SUTURE) ×2
SUT VIC AB 3-0 CT1 TAPERPNT 27 (SUTURE) ×1 IMPLANT
TOWEL OR 17X24 6PK STRL BLUE (TOWEL DISPOSABLE) ×3 IMPLANT
TRAY FOLEY CATH SILVER 14FR (SET/KITS/TRAYS/PACK) ×3 IMPLANT

## 2015-08-13 NOTE — Anesthesia Postprocedure Evaluation (Signed)
Anesthesia Post Note  Patient: Dana PennerJessica C [name]  Procedure(s) Performed: Procedure(s) (LRB): CESAREAN SECTION REPEAT (N/A)  Patient location during evaluation: PACU Anesthesia Type: Spinal Level of consciousness: awake Pain management: pain level controlled Respiratory status: spontaneous breathing Cardiovascular status: stable Postop Assessment: No headache, No backache and Spinal receding Anesthetic complications: no    Last Vitals:  Filed Vitals:   08/13/15 1534 08/13/15 1535  BP:    Pulse: 78 81  Temp:    Resp: 18 19    Last Pain:  Filed Vitals:   08/13/15 1536  PainSc: 0-No pain                 Raistlin Gum JR,JOHN Izen Petz

## 2015-08-13 NOTE — Lactation Note (Signed)
This note was copied from the chart of Dana Eustace PenJessica Cravey. Lactation Consultation Note  Patient Name: Dana [name] QMVHQ'IToday's Date: 08/13/2015 Reason for consult: Initial assessment   With this mom and term baby, now 8 hours old. Mom 's first was a NICU premie, and he never latched, . Mom has been on HCTZ and has a history high blood pressure. She states her milk came in well with her first, but was gone after 3 months of pumping. I avised mom to stay hydrated, and that if she needs HCTZ, , that she is more important than her milk.  Harlow [place] was too sleepy to latch, but mom will call when she is showing hunger cues. Basic breast feeding teaching done, and lactation services reviewed.    Maternal Data Formula Feeding for Exclusion: No Has patient been taught Hand Expression?: Yes Does the patient have breastfeeding experience prior to this delivery?: Yes  Feeding Feeding Type: Breast Fed  LATCH Score/Interventions Latch: Too sleepy or reluctant, no latch achieved, no sucking elicited. (post path, baby too sleepy - no latch) Intervention(s): Skin to skin;Teach feeding cues (mom has easily expressed colostrum) Intervention(s): Adjust position;Assist with latch;Breast compression  Audible Swallowing: None  Type of Nipple: Flat (evert with stmulation)  Comfort (Breast/Nipple): Soft / non-tender     Hold (Positioning): Assistance needed to correctly position infant at breast and maintain latch. Intervention(s): Breastfeeding basics reviewed;Support Pillows;Position options;Skin to skin  LATCH Score: 4  Lactation Tools Discussed/Used     Consult Status Consult Status: Follow-up Date: 08/14/15 Follow-up type: In-patient    Alfred LevinsLee, Manvir Thorson Anne 08/13/2015, 9:31 PM

## 2015-08-13 NOTE — Consult Note (Signed)
[facility]  Delivery Note:  C-section       08/13/2015  1:12 PM  I was called to the operating room at the request of the patient's obstetrician (Dr. McComb) for a repeat c-section.  PRENATAL HX:  29 y/o G2P1001 at 39 weeks.  Prenatal course complicated by chronic hypertension on hydrochlorothiazide.  INTRAPARTUM HX:   Repeat c-section with AROM at delivery  DELIVERY:  Infant was vigorous at delivery, requiring no resuscitation other than standard warming, drying and stimulation.  APGARs 9 and 9.  Exam within normal limits.  After 5 minutes, baby left with nurse to assist parents with skin-to-skin care.   _____________________ Electronically Signed By: Kenyotta Dorfman, MD Neonatologist  

## 2015-08-13 NOTE — Transfer of Care (Signed)
Immediate Anesthesia Transfer of Care Note  Patient: Dana [name]  Procedure(s) Performed: Procedure(s) with comments: CESAREAN SECTION REPEAT (N/A) - edc 08/20/15   Patient Location: PACU  Anesthesia Type:Spinal  Level of Consciousness: awake, alert  and oriented  Airway & Oxygen Therapy: Patient Spontanous Breathing  Post-op Assessment: Report given to RN and Post -op Vital signs reviewed and stable  Post vital signs: Reviewed and stable  Last Vitals:  Filed Vitals:   08/13/15 1134 08/13/15 1144  BP:  147/92  Pulse:  100  Temp: 37 C 37 C  Resp:  20    Complications: No apparent anesthesia complications

## 2015-08-13 NOTE — Progress Notes (Signed)
[place] notified for sign out from pacu

## 2015-08-13 NOTE — H&P (Signed)
History and physical exam unchangedHistory and physical exam unchanged 

## 2015-08-13 NOTE — Anesthesia Preprocedure Evaluation (Signed)
Anesthesia Evaluation  Patient identified by MRN, date of birth, ID band Patient awake    Reviewed: Allergy & Precautions, NPO status , Patient's Chart, lab work & pertinent test results  Airway Mallampati: III  TM Distance: >3 FB Neck ROM: Full    Dental no notable dental hx. (+) Teeth Intact   Pulmonary former smoker,    Pulmonary exam normal breath sounds clear to auscultation       Cardiovascular hypertension, Normal cardiovascular exam Rhythm:Regular Rate:Normal  Hx/o PIH   Neuro/Psych negative neurological ROS  negative psych ROS   GI/Hepatic Neg liver ROS, GERD  Medicated and Controlled,  Endo/Other  Obesity  Renal/GU negative Renal ROS  negative genitourinary   Musculoskeletal  (+) Arthritis ,   Abdominal (+) + obese,   Peds  Hematology   Anesthesia Other Findings   Reproductive/Obstetrics (+) Pregnancy                             Anesthesia Physical Anesthesia Plan  ASA: II  Anesthesia Plan: Spinal   Post-op Pain Management:    Induction:   Airway Management Planned: Natural Airway  Additional Equipment:   Intra-op Plan:   Post-operative Plan:   Informed Consent: I have reviewed the patients History and Physical, chart, labs and discussed the procedure including the risks, benefits and alternatives for the proposed anesthesia with the patient or authorized representative who has indicated his/her understanding and acceptance.   Dental advisory given  Plan Discussed with: CRNA, Anesthesiologist and Surgeon  Anesthesia Plan Comments:         Anesthesia Quick Evaluation

## 2015-08-13 NOTE — Brief Op Note (Signed)
08/13/2015  1:43 PM  PATIENT:  Noelle PennerJessica C Pyatt  29 y.o. female  PRE-OPERATIVE DIAGNOSIS:  Repeat cesarean section  POST-OPERATIVE DIAGNOSIS:  Repeat cesarean section  PROCEDURE:  Procedure(s) with comments: CESAREAN SECTION REPEAT (N/A) - edc 08/20/15   SURGEON:  Surgeon(s) and Role:    * Richardean ChimeraJohn Makensey Rego, MD - Primary  PHYSICIAN ASSISTANT:   ASSISTANTS: none   ANESTHESIA:   spinal  EBL:  Total I/O In: 1400 [I.V.:1400] Out: 700 [Urine:100; Blood:600]  BLOOD ADMINISTERED:none  DRAINS: Urinary Catheter (Foley)   LOCAL MEDICATIONS USED:  NONE  SPECIMEN:  No Specimen  DISPOSITION OF SPECIMEN:  N/A  COUNTS:  YES  TOURNIQUET:  * No tourniquets in log *  DICTATION: .Other Dictation: Dictation Number [id number]  PLAN OF CARE: Admit to inpatient   PATIENT DISPOSITION:  PACU - hemodynamically stable.   Delay start of Pharmacological VTE agent (>24hrs) due to surgical blood loss or risk of bleeding: no

## 2015-08-13 NOTE — Anesthesia Procedure Notes (Signed)
Spinal Patient location during procedure: OR Start time: 08/13/2015 12:57 PM Staffing Anesthesiologist: Mal AmabileFOSTER, Rey Dansby Performed by: anesthesiologist  Preanesthetic Checklist Completed: patient identified, site marked, surgical consent, pre-op evaluation, timeout performed, IV checked, risks and benefits discussed and monitors and equipment checked Spinal Block Patient position: sitting Prep: site prepped and draped and DuraPrep Patient monitoring: heart rate, cardiac monitor, continuous pulse ox and blood pressure Approach: midline Location: L3-4 Injection technique: single-shot Needle Needle type: Sprotte  Needle gauge: 24 G Needle length: 9 cm Needle insertion depth: 5 cm Assessment Sensory level: T4 Additional Notes Patient tolerated procedure well. Adequate sensory level.

## 2015-08-14 ENCOUNTER — Encounter (HOSPITAL_COMMUNITY): Payer: Self-pay | Admitting: Obstetrics and Gynecology

## 2015-08-14 LAB — TYPE AND SCREEN
ABO/RH(D): A POS
Antibody Screen: NEGATIVE
UNIT DIVISION: 0
UNIT DIVISION: 0
Unit division: 0
Unit division: 0

## 2015-08-14 LAB — CBC
HEMATOCRIT: 32.2 % — AB (ref 36.0–46.0)
Hemoglobin: 10.7 g/dL — ABNORMAL LOW (ref 12.0–15.0)
MCH: 30.7 pg (ref 26.0–34.0)
MCHC: 33.2 g/dL (ref 30.0–36.0)
MCV: 92.5 fL (ref 78.0–100.0)
Platelets: 262 10*3/uL (ref 150–400)
RBC: 3.48 MIL/uL — ABNORMAL LOW (ref 3.87–5.11)
RDW: 13.6 % (ref 11.5–15.5)
WBC: 18 10*3/uL — AB (ref 4.0–10.5)

## 2015-08-14 LAB — BIRTH TISSUE RECOVERY COLLECTION (PLACENTA DONATION)

## 2015-08-14 MED ORDER — PANTOPRAZOLE SODIUM 40 MG PO TBEC
40.0000 mg | DELAYED_RELEASE_TABLET | Freq: Every day | ORAL | Status: DC
  Administered 2015-08-14: 40 mg via ORAL
  Filled 2015-08-14: qty 1

## 2015-08-14 NOTE — Lactation Note (Signed)
This note was copied from the chart of Dana Eustace PenJessica Zale. Lactation Consultation Note  Patient Name: Dana [name] ZOXWR'UToday's Date: 08/14/2015 Reason for consult: Follow-up assessment Baby at 32 hr of life and parents are worried that she is not eating well. FOB stated that baby is very fussy and on the breast all the time. Reviewed normal baby behavior for day 2. Mom stated that she does not think the baby is latching well. Mom's L nipple does have a very short shaft and the R nipple is flat with an inverted center. Bilateral bruising noted, mom reports no discomfort. Mom is having a hard time latching baby to the R nipple. Suggested that she try a #20 NS and she agreed. She stated several times that she thinks she needs to pump or offer formula so that she can see the baby eat. Encouraged mom to keep putting baby to the breast and she can pump for stimulation not for volume after latching baby. Discussed pacifier use, feeding frequency, breast changes, and nipple care. Mom states that she can manually express.   Maternal Data    Feeding Feeding Type: Breast Fed Length of feed: 20 min  LATCH Score/Interventions Latch: Repeated attempts needed to sustain latch, nipple held in mouth throughout feeding, stimulation needed to elicit sucking reflex. Intervention(s): Skin to skin Intervention(s): Adjust position;Assist with latch;Breast compression  Audible Swallowing: Spontaneous and intermittent Intervention(s): Hand expression;Skin to skin  Type of Nipple: Flat  Comfort (Breast/Nipple): Filling, red/small blisters or bruises, mild/mod discomfort  Problem noted: Cracked, bleeding, blisters, bruises Interventions  (Cracked/bleeding/bruising/blister): Expressed breast milk to nipple  Hold (Positioning): Assistance needed to correctly position infant at breast and maintain latch. Intervention(s): Support Pillows;Position options  LATCH Score: 6  Lactation Tools Discussed/Used Tools:  Nipple Shields Nipple shield size: 20 Pump Review: Setup, frequency, and cleaning;Milk Storage Initiated by:: ES Date initiated:: 08/14/15   Consult Status Consult Status: Follow-up Date: 08/15/15 Follow-up type: In-patient    Dana [name] 08/14/2015, 9:55 PM

## 2015-08-14 NOTE — Progress Notes (Signed)
Subjective: Postpartum Day 1: Cesarean Delivery Patient reports tolerating PO and + flatus.    Objective: Vital signs in last 24 hours: Temp:  [97.5 F (36.4 C)-98.6 F (37 C)] 98 F (36.7 C) (11/29 0330) Pulse Rate:  [63-100] 72 (11/29 0330) Resp:  [10-27] 18 (11/29 0330) BP: (103-147)/(55-92) 109/55 mmHg (11/29 0330) SpO2:  [93 %-99 %] 95 % (11/29 0330)  Physical Exam:  General: alert and cooperative Lochia: appropriate Uterine Fundus: firm Incision: healing well DVT Evaluation: No evidence of DVT seen on physical exam. Negative Homan's sign. No cords or calf tenderness. No significant calf/ankle edema.   Recent Labs  08/14/15 0512  HGB 10.7*  HCT 32.2*    Assessment/Plan: Status post Cesarean section. Doing well postoperatively.  D/c HCTZ.  Rayette Mogg G 08/14/2015, 8:02 AM

## 2015-08-14 NOTE — Op Note (Signed)
NAMCelso [name]:  [place], [name]                ACCOUNT NO.:  [id number]  MEDICAL RECORD NO.:  [id number]  LOCATION:  9126                          FACILITY:  [facility]  PHYSICIAN:  [name] Adamari [name], M.D.   DATE OF BIRTH:  1986-01-07  DATE OF PROCEDURE:  08/13/2015 DATE OF DISCHARGE:                              OPERATIVE REPORT   PREOPERATIVE DIAGNOSES: 1. Intrauterine pregnancy at 39 weeks with prior cesarean section,     desires repeat. 2. Chronic hypertension.  POSTOPERATIVE DIAGNOSES: 1. Intrauterine pregnancy at 39 weeks with prior cesarean section,     desires repeat. 2. Chronic hypertension.  OPERATIVE PROCEDURE:  Repeat low-transverse cesarean section.  SURGEON:  [name] [name], M.D.  ANESTHESIA:  Spinal.  ESTIMATED BLOOD LOSS:  400 mL.  PACKS:  None.  DRAINS:  Urethral Foley.  INTRAOPERATIVE BLOOD PLACED:  None.  COMPLICATIONS:  None.  INDICATION:  Dictated in History and Physical.  PROCEDURE IN DETAIL:  The patient was taken to the OR, placed in a supine position with left lateral tilt.  After satisfactory level of spinal anesthesia was obtained, a Foley was placed to straight drain. The abdomen was prepped out with DuraPrep and eventually draped as a sterile field.  We identified a prior low transverse skin incision, but it was so low that we decided to move up from it.  We made a low transverse incision through the skin, carried through subcutaneous tissue.  Fascia was entered sharply.  Incision was fashioned laterally. Fascia was taken off the muscle superiorly inferiorly.  Rectus muscles were separated in midline.  Anterior perineum was entered sharply. Incision of perineum extended both superiorly and inferiorly.  A low- transverse bladder flap was developed.  Low transverse uterine incision begun with a knife, extended laterally using manual traction.  The infant was presented in the vertex presentation, delivered with elevation of head and fundal pressure.  It was a  viable female, Apgars were 9/9.  Placenta was delivered manually.  Uterus was exteriorized for closure.  Tubes and ovaries were unremarkable.  Uterus was closed in a running suture of 0 chromic using a 2-layer closure technique.  At the end of procedure, we had a good hemostasis and clear urine output. Uterus was returned to the abdominal cavity.  We irrigated the pelvis. We had good hemostasis and clear urine output.  Muscles and peritoneum were closed with a running suture of 3-0 Vicryl.  Fascia was closed with running suture of 0 PDS.  Fat layer was closed with a running suture of 0 plain catgut.  Skin was closed with a running subcuticular 4-0 Monocryl.  Dermabond was also put in place.  Sponge, instrument, and needle count was correct by circulating nurse x3.  Urine output remained clear at the time of closure.  The patient tolerated the procedure well and was returned to the recovery room in good condition.     [name] [name], M.D.     JSM/MEDQ  D:  08/13/2015  T:  08/14/2015  Job:  [id number]

## 2015-08-14 NOTE — Anesthesia Postprocedure Evaluation (Signed)
Anesthesia Post Note  Patient: Dana [name]  Procedure(s) Performed: Procedure(s) (LRB): CESAREAN SECTION REPEAT (N/A)  Patient location during evaluation: Mother Baby Anesthesia Type: Spinal Level of consciousness: awake and alert and oriented Pain management: pain level controlled Vital Signs Assessment: post-procedure vital signs reviewed and stable Respiratory status: spontaneous breathing Cardiovascular status: blood pressure returned to baseline and stable Postop Assessment: No headache, No backache, No signs of nausea or vomiting and Adequate PO intake Anesthetic complications: no    Last Vitals:  Filed Vitals:   08/13/15 2330 08/14/15 0330  BP: 103/59 109/55  Pulse: 78 72  Temp: 36.6 C 36.7 C  Resp: 18 18    Last Pain:  Filed Vitals:   08/14/15 0649  PainSc: 5                  Saranne Crislip

## 2015-08-14 NOTE — Addendum Note (Signed)
Addendum  created 08/14/15 0748 by Jhonnie GarnerBeth M Raymund Manrique, CRNA   Modules edited: Clinical Notes   Clinical Notes:  File: [id number]

## 2015-08-15 MED ORDER — IBUPROFEN 600 MG PO TABS: 600.0000 mg | ORAL_TABLET | Freq: Four times a day (QID) | ORAL | Status: AC | PRN

## 2015-08-15 MED ORDER — MEASLES, MUMPS & RUBELLA VAC ~~LOC~~ INJ
0.5000 mL | INJECTION | Freq: Once | SUBCUTANEOUS | Status: AC
  Administered 2015-08-15: 0.5 mL via SUBCUTANEOUS
  Filled 2015-08-15: qty 0.5

## 2015-08-15 MED ORDER — OXYCODONE-ACETAMINOPHEN 5-325 MG PO TABS: 1.0000 | ORAL_TABLET | ORAL | Status: AC | PRN

## 2015-08-15 NOTE — Lactation Note (Signed)
This note was copied from the chart of Dana [name]. Lactation ConsultEustace Penation Note Baby had 8% weight loss at 36 hrs. Had 7 voids, and 8 stools. I feel this may some affect on weight loss. Mom using NS, noted dried colostrum in NS. Mom post-pumping to induce milk production. Mom and dad sleep, dad had baby in recliner, I asked him if I could put baby in bassinet for safety. Stated yes. Mom briefly opened her eyes and spoke. Mom and dad sleepy. Will need LC f/u appointment before d/c home.  Patient Name: Dana Eustace PenJessica Statz ZOXWR'UToday's Date: 08/15/2015 Reason for consult: Follow-up assessment;Infant weight loss   Maternal Data    Feeding Feeding Type: Breast Fed Length of feed: 30 min  LATCH Score/Interventions                      Lactation Tools Discussed/Used     Consult Status Consult Status: Follow-up Date: 08/15/15 Follow-up type: In-patient    Charyl DancerCARVER, River Mckercher G 08/15/2015, 5:58 AM

## 2015-08-15 NOTE — Discharge Summary (Signed)
Obstetric Discharge Summary Reason for Admission: cesarean section Prenatal Procedures: ultrasound Intrapartum Procedures: cesarean: low cervical, transverse Postpartum Procedures: none Complications-Operative and Postpartum: none HEMOGLOBIN  Date Value Ref Range Status  08/14/2015 10.7* 12.0 - 15.0 [name]/dL Final   HCT  Date Value Ref Range Status  08/14/2015 32.2* 36.0 - 46.0 % Final    Physical Exam:  General: alert and cooperative Lochia: appropriate Uterine Fundus: firm Incision: healing well DVT Evaluation: No evidence of DVT seen on physical exam. Negative Homan's sign. No cords or calf tenderness. Calf/Ankle edema is present.  Discharge Diagnoses: Term Pregnancy-delivered  Discharge Information: Date: 08/15/2015 Activity: pelvic rest Diet: routine Medications: PNV, Ibuprofen and Percocet Condition: stable Instructions: refer to practice specific booklet Discharge to: home   Newborn Data: Live born female  Birth Weight: 7 lb 0.3 oz (3184 [name]) APGAR: 9, 9  Home with mother.  Dana [name] 08/15/2015, 8:07 AM

## 2015-08-15 NOTE — Progress Notes (Signed)
UR chart review completed.  

## 2015-08-15 NOTE — Lactation Note (Signed)
This note was copied from the chart of Dana [name]. Lactation Consultation Note  Patient Name: Dana [name] [id number] Date: 08/15/2015 Reason for consult: Follow-up assessment;Difficult latch Assisted Mom with latching baby. Tried without the nipple shield but baby cannot obtain/sustain any depth and will not suckle. Mom reports baby will not sustain a suckling pattern, she reports trying nipple shield but baby did not do any better. Baby is noted to have short anterior lingual frenulum along with short labial frenulum which may be affecting latch. Mom also has short almost flat nipples with thick non-compressible tissue at base of nipple. At this visit, reviewed with Mom how to apply #20 nipple shield, demonstrated how to hand express or pre-load nipple shield using curved tipped syringe. Baby initially chewed on the nipple shield but once pre-loaded latched and developed a good suckling pattern with some audible swallows. Small amount of colostrum visible in the nipple shield. Parents are concerned about baby's weight loss (8%) and with BF not being able to know how much baby is getting at the breast. Mom pump/bottle fed her 1st baby who was 6 weeks pre-term. Parents want to supplement with feeding. RN reviewed formula preparation and supplemental guidelines per hours of age. LC offered to demonstrate finger feeding or SNS to supplement at breast. Mom declined, she will use bottle. Mom plans to call insurance company about pump but will use manual DEBP from pump kit for now. Advised to pump after each feeding for 15 minutes to encourage milk production, protect milk supply. Offered to schedule OP f/u with lactation for next week, Mom declined reporting she will call and that the [employer] RN will be visiting. Engorgement care discussed. Milk storage guidelines reviewed. Advised to BF with each feeding for 15-30 minutes, consider pre-pumping to soften tissue, use nipple shield to latch, pre-load  as needed. Let FOB give supplement via bottle while Mom post pumps. BF only at night. Call for questions/concerns.  Maternal Data    Feeding Feeding Type: Formula Length of feed: 10 min  LATCH Score/Interventions Latch: Repeated attempts needed to sustain latch, nipple held in mouth throughout feeding, stimulation needed to elicit sucking reflex. (using #20 nipple shield) Intervention(s): Adjust position;Assist with latch;Breast massage;Breast compression  Audible Swallowing: A few with stimulation  Type of Nipple: Everted at rest and after stimulation (short shaft bilateral w/swelling base of nipples) Intervention(s): Double electric pump  Comfort (Breast/Nipple): Soft / non-tender     Hold (Positioning): Assistance needed to correctly position infant at breast and maintain latch. Intervention(s): Breastfeeding basics reviewed;Support Pillows;Position options;Skin to skin  LATCH Score: 7  Lactation Tools Discussed/Used Tools: Nipple [employer] Fuel;Pump Nipple shield size: 20 Breast pump type: Double-Electric Breast Pump   Consult Status Consult Status: Complete Date: 08/15/15 Follow-up type: In-patient    Dana [name] 08/15/2015, 9:51 AM

## 2015-08-16 ENCOUNTER — Encounter (HOSPITAL_COMMUNITY)
Admission: RE | Admit: 2015-08-16 | Discharge: 2015-08-16 | Disposition: A | Discharge status: Home / Self Care | Payer: PRIVATE HEALTH INSURANCE | Source: Ambulatory Visit | Attending: Obstetrics and Gynecology | Admitting: Obstetrics and Gynecology

## 2015-09-16 ENCOUNTER — Encounter (HOSPITAL_COMMUNITY)
Admission: RE | Admit: 2015-09-16 | Discharge: 2015-09-16 | Disposition: A | Discharge status: Home / Self Care | Payer: PRIVATE HEALTH INSURANCE | Source: Ambulatory Visit | Attending: Obstetrics and Gynecology | Admitting: Obstetrics and Gynecology

## 2015-10-16 ENCOUNTER — Ambulatory Visit (HOSPITAL_COMMUNITY)
Admission: RE | Admit: 2015-10-16 | Discharge: 2015-10-16 | Disposition: A | Discharge status: Home / Self Care | Payer: PRIVATE HEALTH INSURANCE | Source: Ambulatory Visit | Attending: Obstetrics and Gynecology | Admitting: Obstetrics and Gynecology

## 2015-11-15 ENCOUNTER — Encounter (HOSPITAL_COMMUNITY)
Admission: RE | Admit: 2015-11-15 | Discharge: 2015-11-15 | Disposition: A | Discharge status: Home / Self Care | Payer: PRIVATE HEALTH INSURANCE | Source: Ambulatory Visit | Attending: Obstetrics and Gynecology | Admitting: Obstetrics and Gynecology
